# Patient Record
Sex: Female | Born: 1955 | Race: White | Hispanic: No | Marital: Married | Smoking: Former smoker
Health system: Southern US, Community
[De-identification: ages and names within clinical notes are randomized; demographics above are authoritative.]

## PROBLEM LIST (undated history)

## (undated) DIAGNOSIS — M810 Age-related osteoporosis without current pathological fracture: Secondary | ICD-10-CM

## (undated) DIAGNOSIS — I4891 Unspecified atrial fibrillation: Secondary | ICD-10-CM

## (undated) HISTORY — PX: OTHER SURGICAL HISTORY: SHX169

---

## 2018-03-26 ENCOUNTER — Emergency Department (HOSPITAL_BASED_OUTPATIENT_CLINIC_OR_DEPARTMENT_OTHER)
Admission: EM | Admit: 2018-03-26 | Discharge: 2018-03-26 | Disposition: A | Discharge status: Other Acute Inpatient Hospital | Payer: Worker's Compensation | Attending: Emergency Medicine | Admitting: Emergency Medicine

## 2018-03-26 ENCOUNTER — Other Ambulatory Visit: Payer: Self-pay

## 2018-03-26 ENCOUNTER — Emergency Department (HOSPITAL_BASED_OUTPATIENT_CLINIC_OR_DEPARTMENT_OTHER): Payer: Worker's Compensation

## 2018-03-26 ENCOUNTER — Encounter (HOSPITAL_BASED_OUTPATIENT_CLINIC_OR_DEPARTMENT_OTHER): Payer: Self-pay

## 2018-03-26 DIAGNOSIS — Y929 Unspecified place or not applicable: Secondary | ICD-10-CM | POA: Diagnosis not present

## 2018-03-26 DIAGNOSIS — Z7901 Long term (current) use of anticoagulants: Secondary | ICD-10-CM | POA: Diagnosis not present

## 2018-03-26 DIAGNOSIS — Z79899 Other long term (current) drug therapy: Secondary | ICD-10-CM | POA: Diagnosis not present

## 2018-03-26 DIAGNOSIS — S72002A Fracture of unspecified part of neck of left femur, initial encounter for closed fracture: Secondary | ICD-10-CM

## 2018-03-26 DIAGNOSIS — S79912A Unspecified injury of left hip, initial encounter: Secondary | ICD-10-CM | POA: Diagnosis present

## 2018-03-26 DIAGNOSIS — Y990 Civilian activity done for income or pay: Secondary | ICD-10-CM | POA: Diagnosis not present

## 2018-03-26 DIAGNOSIS — W010XXA Fall on same level from slipping, tripping and stumbling without subsequent striking against object, initial encounter: Secondary | ICD-10-CM | POA: Diagnosis not present

## 2018-03-26 DIAGNOSIS — S32502A Unspecified fracture of left pubis, initial encounter for closed fracture: Secondary | ICD-10-CM | POA: Diagnosis not present

## 2018-03-26 DIAGNOSIS — W19XXXA Unspecified fall, initial encounter: Secondary | ICD-10-CM

## 2018-03-26 DIAGNOSIS — Y939 Activity, unspecified: Secondary | ICD-10-CM | POA: Insufficient documentation

## 2018-03-26 HISTORY — DX: Age-related osteoporosis without current pathological fracture: M81.0

## 2018-03-26 HISTORY — DX: Unspecified atrial fibrillation: I48.91

## 2018-03-26 LAB — BASIC METABOLIC PANEL
Anion gap: 11 (ref 5–15)
BUN: 7 mg/dL (ref 6–20)
CO2: 23 mmol/L (ref 22–32)
Calcium: 8.8 mg/dL — ABNORMAL LOW (ref 8.9–10.3)
Chloride: 102 mmol/L (ref 98–111)
Creatinine, Ser: 0.55 mg/dL (ref 0.44–1.00)
GFR calc Af Amer: >60 mL/min (ref >60)
GFR calc non Af Amer: >60 mL/min (ref >60)
Glucose, Bld: 109 mg/dL — ABNORMAL HIGH (ref 70–99)
Potassium: 4.1 mmol/L (ref 3.5–5.1)
Sodium: 136 mmol/L (ref 135–145)

## 2018-03-26 LAB — CBC WITH DIFFERENTIAL/PLATELET
Basophils Absolute: 0 10*3/uL (ref 0.0–0.1)
Basophils Relative: 0 %
Eosinophils Absolute: 0 10*3/uL (ref 0.0–0.7)
Eosinophils Relative: 0 %
HCT: 38.7 % (ref 36.0–46.0)
Hemoglobin: 13.8 g/dL (ref 12.0–15.0)
Lymphocytes Relative: 9 %
Lymphs Abs: 1 10*3/uL (ref 0.7–4.0)
MCH: 34.3 pg — ABNORMAL HIGH (ref 26.0–34.0)
MCHC: 35.7 g/dL (ref 30.0–36.0)
MCV: 96.3 fL (ref 78.0–100.0)
Monocytes Absolute: 0.6 10*3/uL (ref 0.1–1.0)
Monocytes Relative: 6 %
Neutro Abs: 9.8 10*3/uL — ABNORMAL HIGH (ref 1.7–7.7)
Neutrophils Relative %: 85 %
Platelets: 266 10*3/uL (ref 150–400)
RBC: 4.02 MIL/uL (ref 3.87–5.11)
RDW: 11.7 % (ref 11.5–15.5)
WBC: 11.5 10*3/uL — ABNORMAL HIGH (ref 4.0–10.5)

## 2018-03-26 MED ORDER — MORPHINE SULFATE (PF) 4 MG/ML IV SOLN
4.0000 mg | INTRAVENOUS | Status: DC | PRN
  Filled 2018-03-26: qty 1

## 2018-03-26 MED ORDER — ACETAMINOPHEN 500 MG PO TABS
1000.0000 mg | ORAL_TABLET | Freq: Four times a day (QID) | ORAL | Status: DC | PRN
  Administered 2018-03-26: 1000 mg via ORAL

## 2018-03-26 MED ORDER — ACETAMINOPHEN 500 MG PO TABS
1000.0000 mg | ORAL_TABLET | Freq: Once | ORAL | Status: AC
  Administered 2018-03-26: 1000 mg via ORAL
  Filled 2018-03-26: qty 2

## 2018-03-26 MED ORDER — ACETAMINOPHEN 500 MG PO TABS
ORAL_TABLET | ORAL | Status: AC
  Filled 2018-03-26: qty 2

## 2018-03-26 NOTE — ED Notes (Signed)
Report to [facility]hering[facility]oughCrystal, Charity fundraiserN [facility].

## 2018-03-26 NOTE — ED Notes (Signed)
[facility]

## 2018-03-26 NOTE — ED Notes (Signed)
Pt in XR. 

## 2018-03-26 NOTE — ED Triage Notes (Signed)
PTAR report-pt fell at work approx 1023am-tylenol 1000mg  taken after-c/o pain to left hip -[facility]came in via stretcher and pivoted to w/c-pt agrees to report-to triage in w/c-NAD

## 2018-03-26 NOTE — ED Provider Notes (Addendum)
[facility] EMERGENCY DEPARTMENT Provider Note   CSN: [id number] Arrival date & time: 03/26/18  1253     History   Chief Complaint Chief Complaint  Patient presents with  . Fall    HPI Barbara Terry is a 62 y.o. female with history of A. fib on Xarelto presents today for evaluation of acute onset, constant left hip pain secondary to mechanical fall earlier today.  She states that at around 10:30 AM this morning she was at work when she bent down to reach for an object that was falling and in the process fell on her left hip.  She denies head injury or loss of consciousness.  She denies headache or neck pain.  She denies back pain.  She notes aching pain to the left hip which is constant and becomes more severe with any flexion or certain position changes.  Pain radiates to the knee.  She has not been able to bear weight on the extremity since the fall.  She states she took Tylenol at around 1030 this morning with improvement in her pain.  The history is provided by the patient.    Past Medical History:  Diagnosis Date  . A-fib (HCC)   . Osteoporosis     There are no active problems to display for this patient.   Past Surgical History:  Procedure Laterality Date  . arm surgery       OB History   None      Home Medications    Prior to Admission medications   Not on File    Family History No family history on file.  Social History Social History   Tobacco Use  . Smoking status: Former Games developermoker  . Smokeless tobacco: Never Used  Substance Use Topics  . Alcohol use: Yes    Comment: occ  . Drug use: Never     Allergies   Codeine   Review of Systems Review of Systems  Constitutional: Negative for chills and fever.  Musculoskeletal: Positive for arthralgias and myalgias. Negative for back pain and neck pain.  Neurological: Negative for syncope, weakness, numbness and headaches.  Hematological: Bruises/bleeds easily.     Physical Exam Updated  Vital Signs BP 134/74 (BP Location: Left Arm)   Pulse 76   Temp 98.4 F (36.9 C) (Oral)   Resp 16   Ht 5\' 7"  (1.702 m)   Wt 56.7 kg   SpO2 100%   BMI 19.58 kg/m   Physical Exam  Constitutional: She appears well-developed and well-nourished. No distress.  HENT:  Head: Normocephalic and atraumatic.  No Battle's signs, no raccoon's eyes, no rhinorrhea. No hemotympanum. No tenderness to palpation of the face or skull. No deformity, crepitus, or swelling noted.   Eyes: Conjunctivae are normal. Right eye exhibits no discharge. Left eye exhibits no discharge.  Neck: Normal range of motion. Neck supple. No JVD present.  No midline spine TTP, no paraspinal muscle tenderness, no deformity, crepitus, or step-off noted   Cardiovascular: Normal rate and intact distal pulses.  2+ DP/PT pulses bilaterally.  Compartments are soft, no lower extreme edema, no palpable cords, Homans sign absent bilaterally  Pulmonary/Chest: Effort normal.  Abdominal: She exhibits no distension.  Musculoskeletal: She exhibits tenderness. She exhibits no edema.  No midline spine TTP, no paraspinal muscle tenderness, no deformity, crepitus, or step-off noted.  Mild tenderness to palpation overlying the initial tuberosity on the left.  Subtle leg length discrepancy, left shorter than right.  Patient is able to tense  her quadriceps muscles but unable to lift/flex the left lower extremity.  Otherwise 5/5 strength of BLE major muscle groups.  No erythema, deformity, crepitus noted.  Neurological: She is alert.  Fluent speech, no facial droop, sensation intact to soft touch of bilateral lower extremity's.  Unable to assess gait secondary to patient's pain.  Skin: Skin is warm and dry. No erythema.  Psychiatric: She has a normal mood and affect. Her behavior is normal.  Nursing note and vitals reviewed.    ED Treatments / Results  Labs (all labs ordered are listed, but only abnormal results are displayed) Labs Reviewed    BASIC METABOLIC PANEL - Abnormal; Notable for the following components:      Result Value   Glucose, Bld 109 (*)    Calcium 8.8 (*)    All other components within normal limits  CBC WITH DIFFERENTIAL/PLATELET - Abnormal; Notable for the following components:   WBC 11.5 (*)    MCH 34.3 (*)    Neutro Abs 9.8 (*)    All other components within normal limits    EKG None  Radiology Ct Hip Left Wo Contrast  Result Date: 03/26/2018 CLINICAL DATA:  Left hip pain after fall. EXAM: CT OF THE LEFT HIP WITHOUT CONTRAST TECHNIQUE: Multidetector CT imaging of the left hip was performed according to the standard protocol. Multiplanar CT image reconstructions were also generated. COMPARISON:  Left hip x-rays from same day. FINDINGS: Bones/Joint/Cartilage Acute, minimally impacted left subcapital femoral neck fracture. Nondisplaced fracture of the left parasymphyseal pubic bone. Probable nondisplaced fracture of the left inferior pubic ramus. No dislocation. Small lipohemarthrosis. Ligaments Suboptimally assessed by CT. Muscles and Tendons Unremarkable. Soft tissues Partially visualized large hypodense mass in the pelvis, measuring at least 13.8 cm. IMPRESSION: 1. Acute, minimally impacted left subcapital femoral neck fracture. 2. Nondisplaced fracture of the left parasymphyseal pubic bone. Probable nondisplaced fracture of the left inferior pubic ramus. 3. Partially visualized large hypodense mass in the pelvis, measuring at least 13.8 cm. This may be ovarian in etiology. Recommend CT of the abdomen and pelvis with contrast for further evaluation. Electronically Signed   By: Obie Dredge M.D.   On: 03/26/2018 15:55   Dg Hip Unilat With Pelvis 2-3 Views Left  Result Date: 03/26/2018 CLINICAL DATA:  Fall. EXAM: DG HIP (WITH OR WITHOUT PELVIS) 2-3V LEFT COMPARISON:  No recent. FINDINGS: Diffuse osteopenia. Degenerative change lumbar spine and both hips. Subtle fracture of the left femoral neck cannot be  excluded. IMPRESSION: 1.  Subtle fracture of the left femoral neck cannot be excluded. 2. Diffuse osteopenia. Degenerative changes lumbar spine and both hips. Electronically Signed   By: Maisie Fus  Register   On: 03/26/2018 14:29    Procedures Procedures (including critical care time)  Medications Ordered in ED Medications  morphine 4 MG/ML injection 4 mg (has no administration in time range)  acetaminophen (TYLENOL) tablet 1,000 mg (1,000 mg Oral Given 03/26/18 1524)     Initial Impression / Assessment and Plan / ED Course  I have reviewed the triage vital signs and the nursing notes.  Pertinent labs & imaging results that were available during my care of the patient were reviewed by me and considered in my medical decision making (see chart for details).     Patient presents for evaluation of left hip pain secondary to mechanical fall earlier today.  She is afebrile, vital signs are stable.  She is nontoxic in appearance.  She is neurovascularly intact but is unable to flex  her left hip secondary to pain.  Compartments are soft.  Radiographs show diffuse osteopenia and cannot exclude left femoral neck fracture.  Given the patient is unable to bear weight or ambulate will obtain CT for further evaluation.  CT shows acute minimally impacted left subcapital femoral neck fracture as well as nondisplaced fracture of left parasymphyseal pubic bone.  She also has a probable nondisplaced fracture of the left inferior pubic ramus.  Pain managed with p.o. Tylenol while in the ED.  No sign of head trauma, no midline spine tenderness.  I do not see need for any further imaging.  [facility] and care. 6:28 PM Spoke with Dr. Oralia RudWilliam Craig with [facility] who agrees to assume care of patient and bring her into the hospital for further evaluation and management of her acute fractures.  On  reevaluation, patient remains in no acute distress, intact peripheral pulses with good sensation to soft touch.  Patient seen and evaluated by Dr. Adela LankFloyd who agrees with assessment and plan at this time.  Final Clinical Impressions(s) / ED Diagnoses   Final diagnoses:  Closed fracture of neck of left femur, initial encounter [facility](HCC)  Closed fracture of left pubis, unspecified portion of pubis, initial encounter [facility](HCC)  Fall, initial encounter    ED Discharge Orders    None       Jeanie SewerFawze, Kaiesha Tonner A, PA-C 03/26/18 2009    Floyd, Dan, DO 03/26/18 2017    Michela PitcherFawze, Keriann Rankin A, PA-C 03/26/18 2031    Melene PlanFloyd, Dan, DO 03/26/18 2133

## 2018-03-26 NOTE — ED Notes (Signed)
Patient transported to CT 

## 2019-12-06 IMAGING — CR DG HIP (WITH OR WITHOUT PELVIS) 2-3V*L*
3 series · 3 of 3 positions shown · non-contrast
Comparison: No recent.

CLINICAL DATA: Fall.

EXAM:
DG HIP (WITH OR WITHOUT PELVIS) 2-3V LEFT

[t pelvis a.p.]
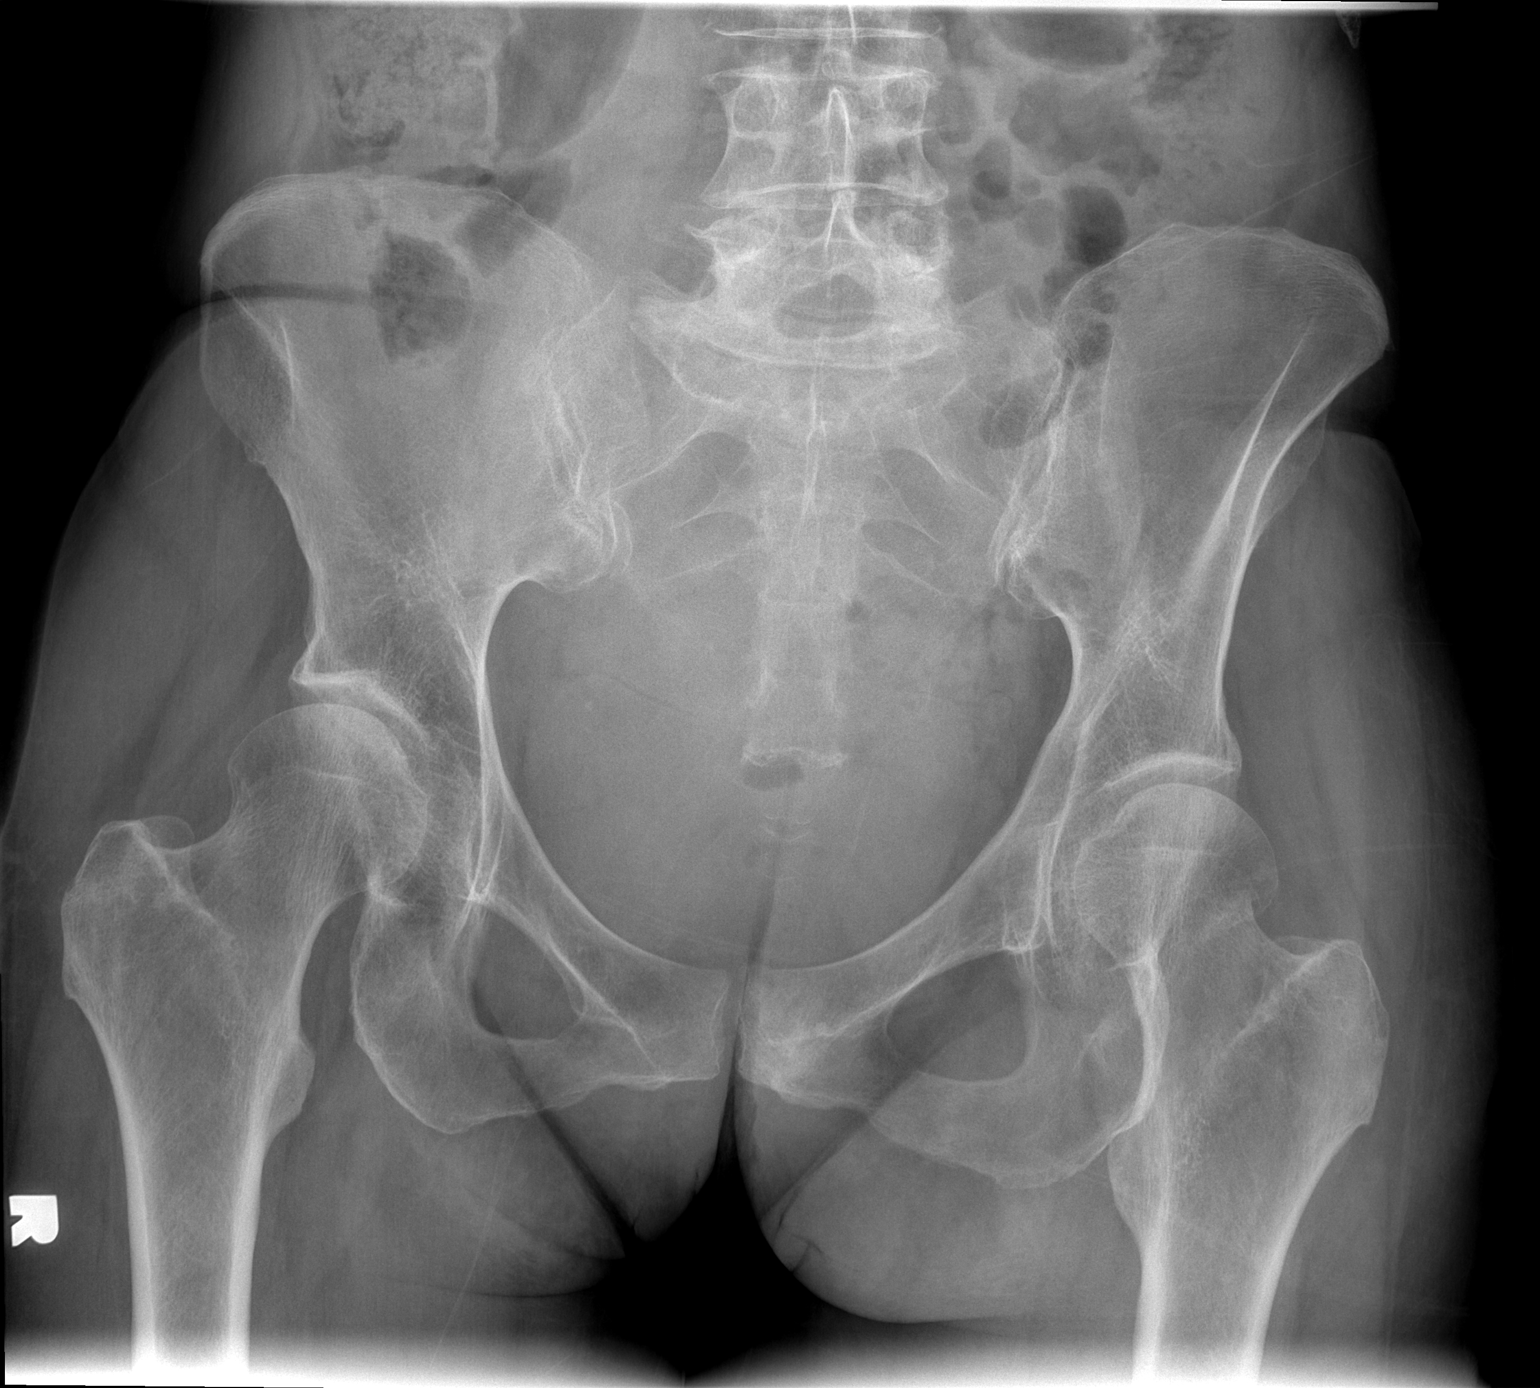

[t hip ap left]
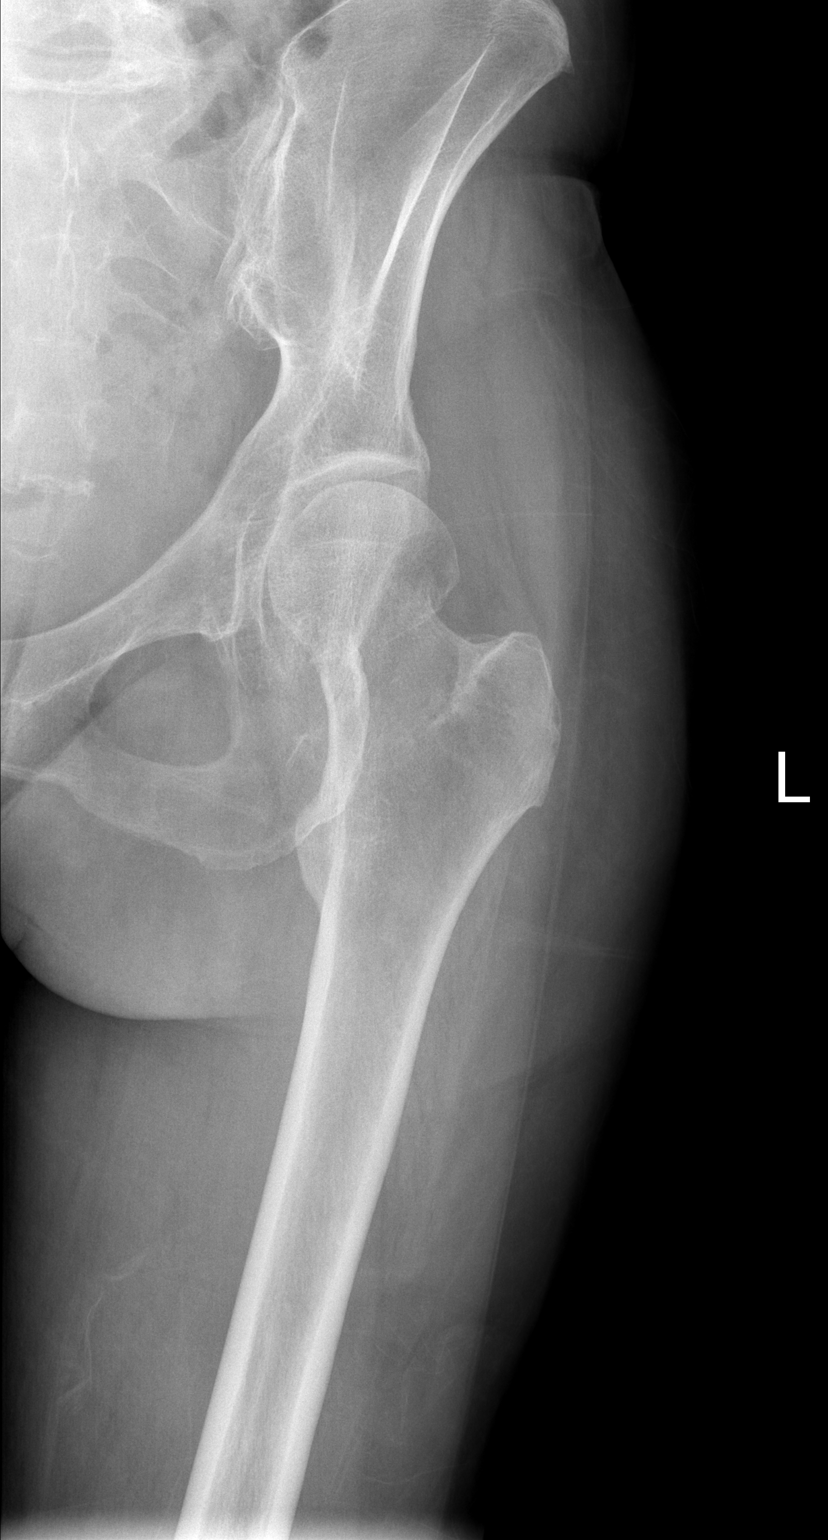

[t hip frog leg left]
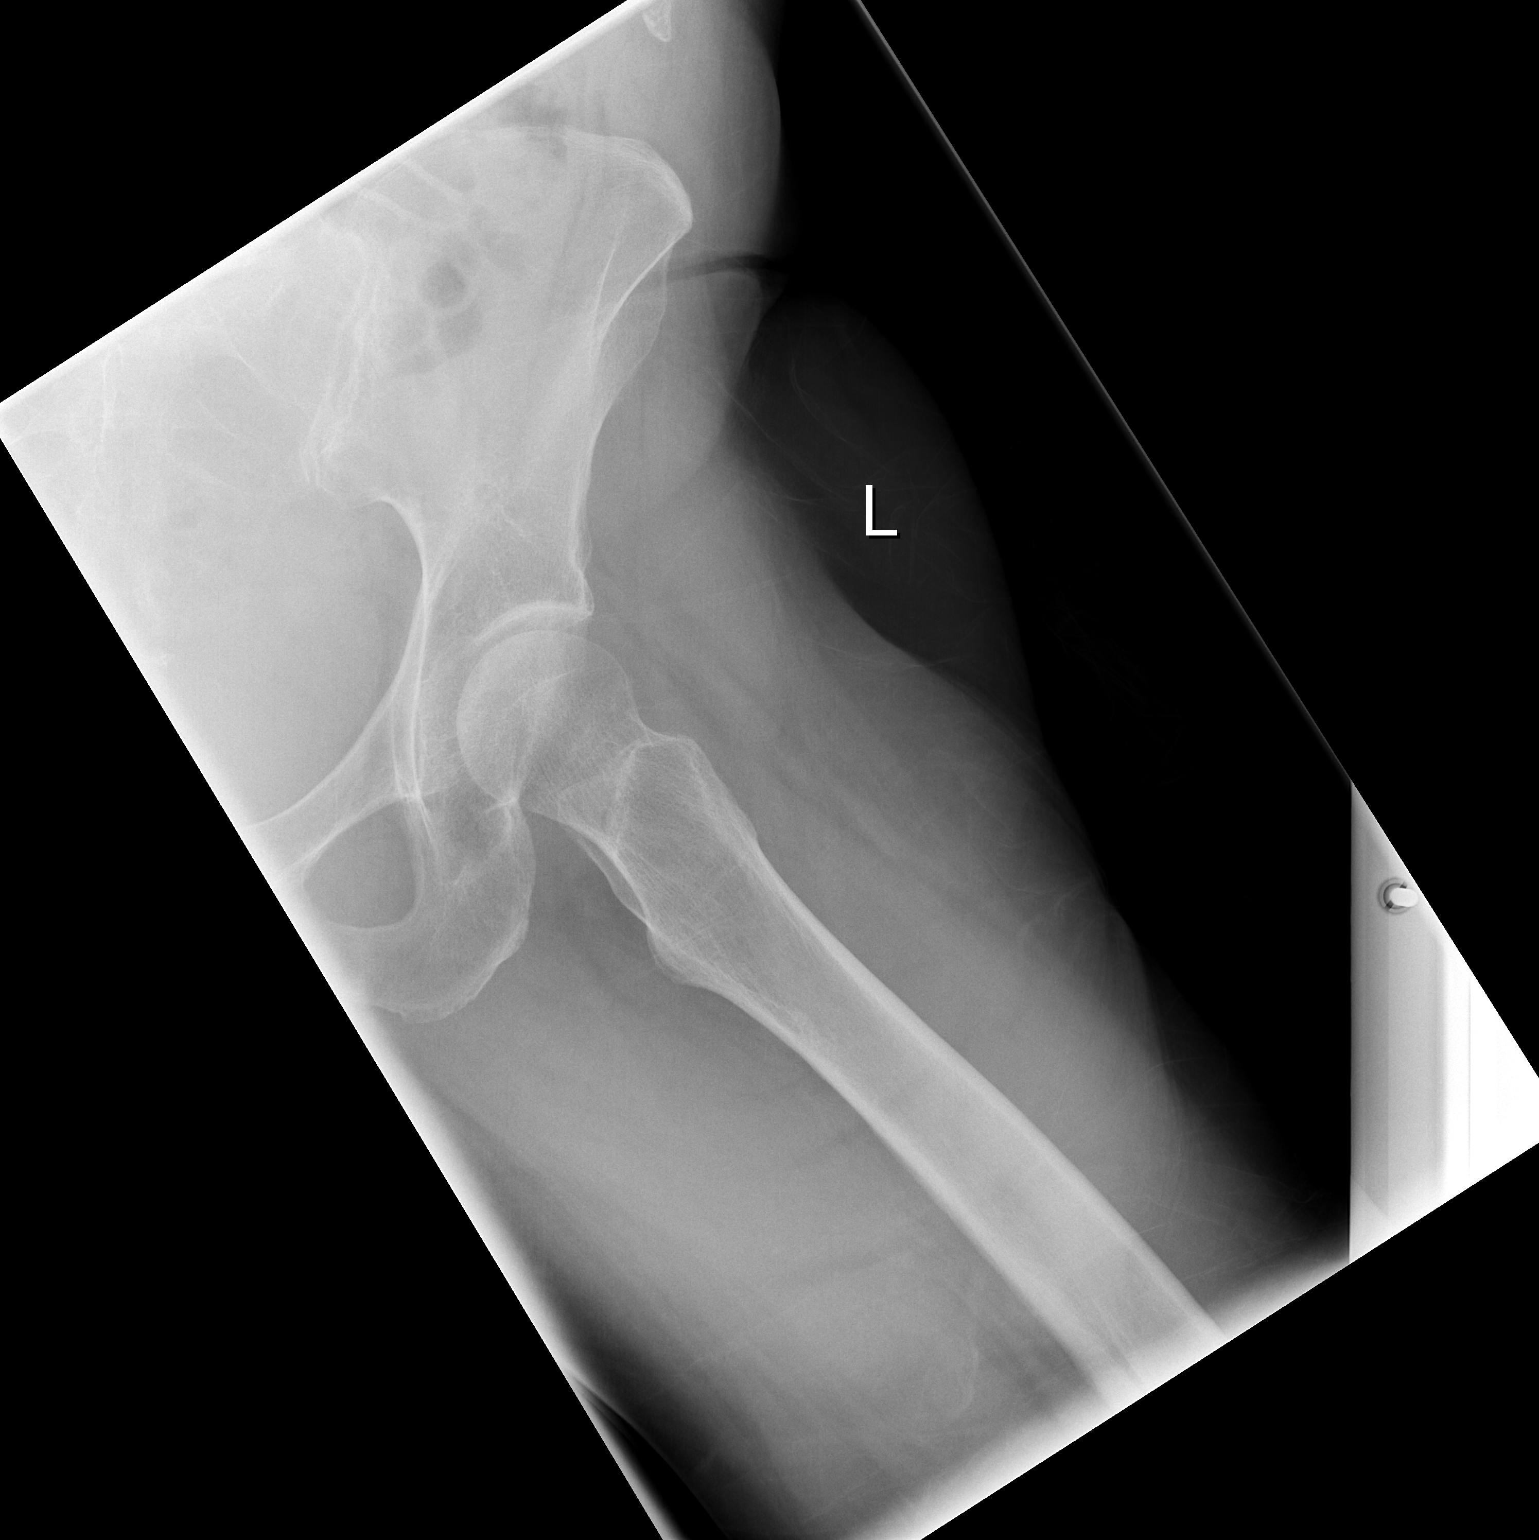

[3 of 3 positions shown; findings below may reference images not displayed]

FINDINGS: Diffuse osteopenia. Degenerative change lumbar spine and both hips.
Subtle fracture of the left femoral neck cannot be excluded.
IMPRESSION: 1.  Subtle fracture of the left femoral neck cannot be excluded.

2. Diffuse osteopenia. Degenerative changes lumbar spine and both
hips.

## 2019-12-06 IMAGING — CT CT HIP*L* W/O CM
2 of 3 series · 17 of 46 positions shown, 19 images · non-contrast
Comparison: Left hip x-rays from same day.

CLINICAL DATA: Left hip pain after fall.

EXAM:
CT OF THE LEFT HIP WITHOUT CONTRAST
TECHNIQUE: Multidetector CT imaging of the left hip was performed according to
the standard protocol. Multiplanar CT image reconstructions were
also generated.

[Series 5: coronal st · coronal · 0.37mm/px · 3 of 88 slices shown]
[im 30/88  soft-tissue]
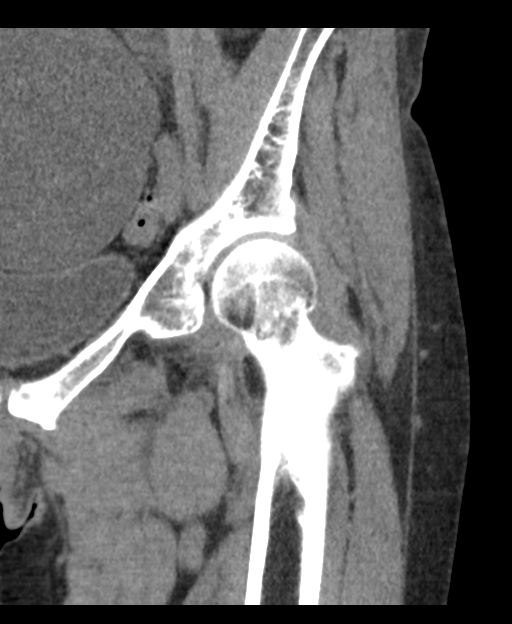
[im 39/88  soft-tissue]
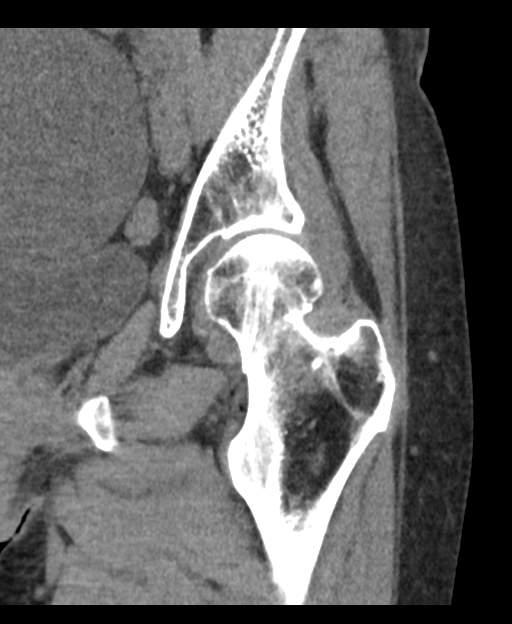
[im 49/88  soft-tissue]
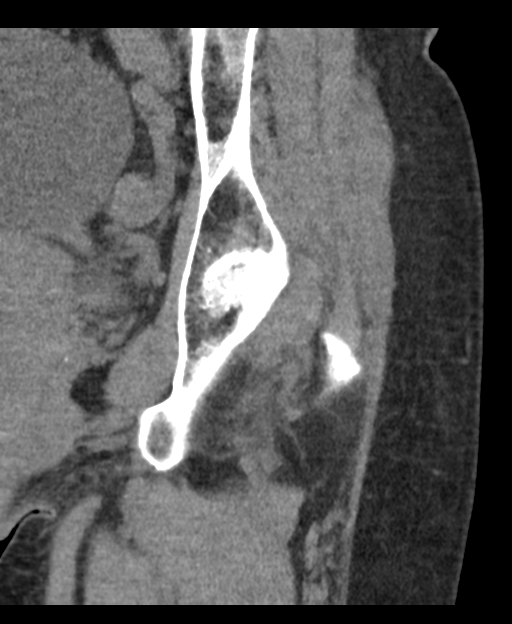

[Series 9: axial soft tissue · axial · 0.38mm/px · z∈[-284,-100]mm · 14 of 106 slices shown, 16 images]
[im 7/106  soft-tissue]
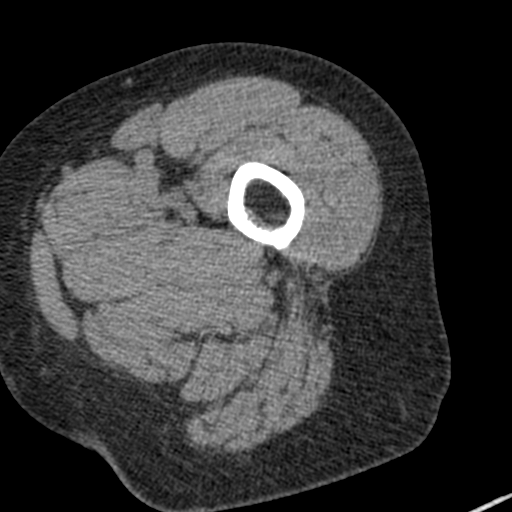
[im 7/106  bone]
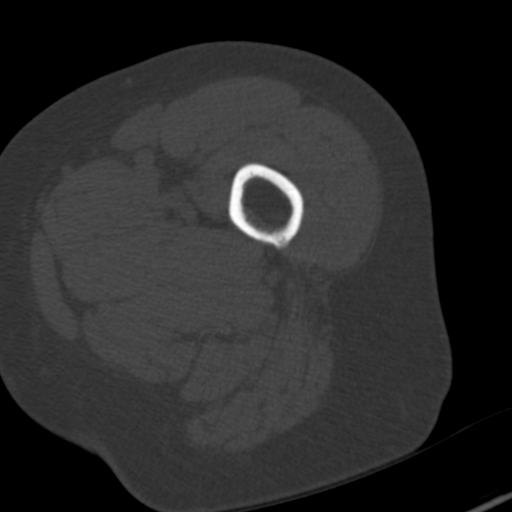
[im 14/106  soft-tissue]
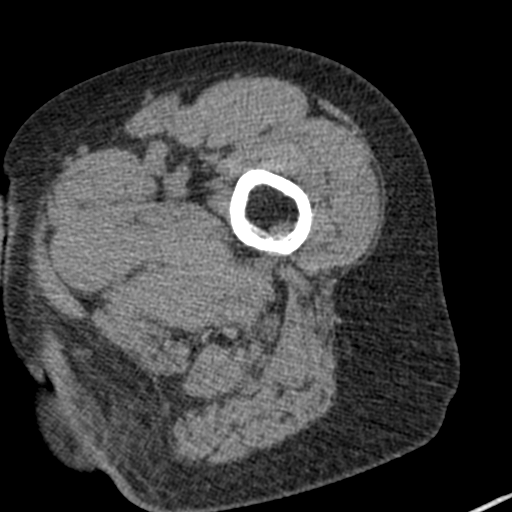
[im 21/106  soft-tissue]
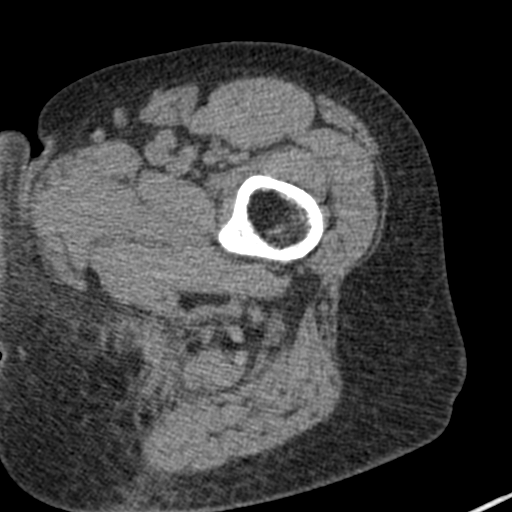
[im 28/106  soft-tissue]
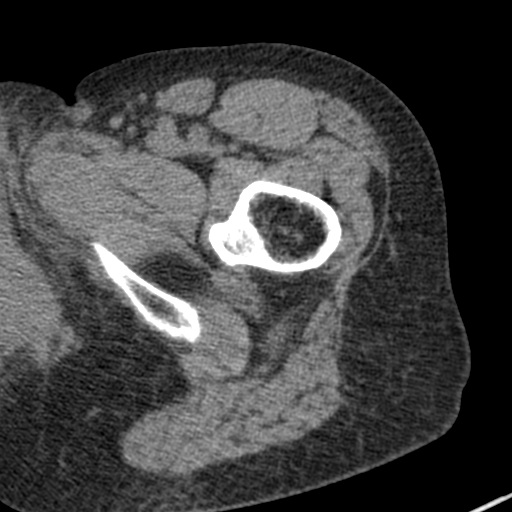
[im 34/106  soft-tissue]
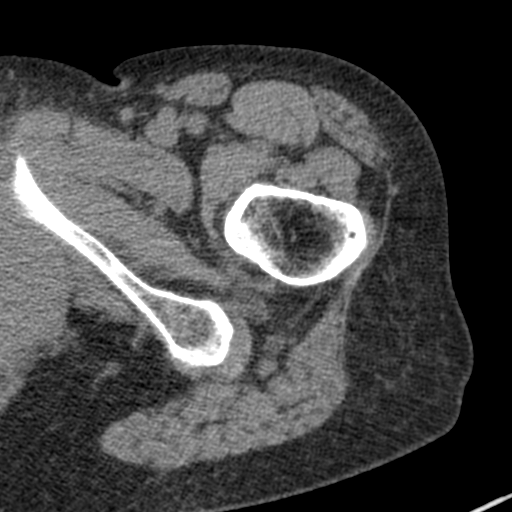
[im 41/106  soft-tissue]
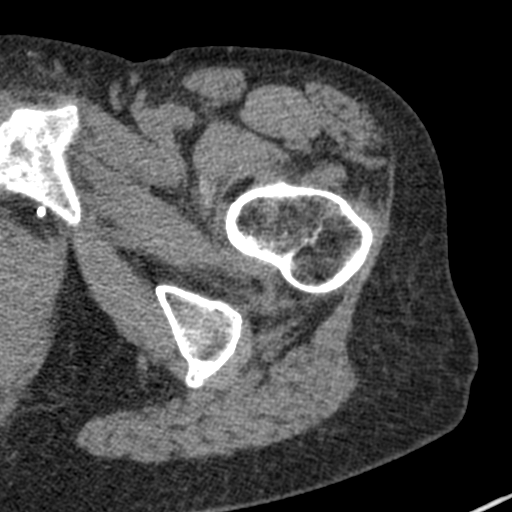
[im 48/106  soft-tissue]
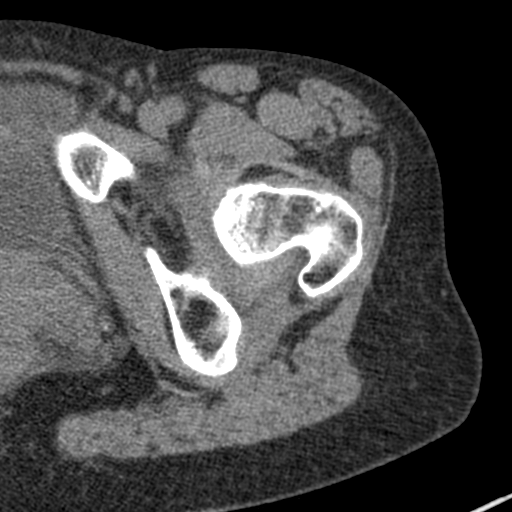
[im 58/106  soft-tissue]
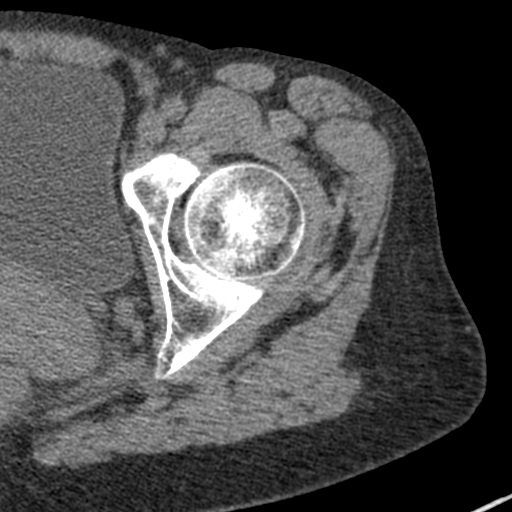
[im 65/106  soft-tissue]
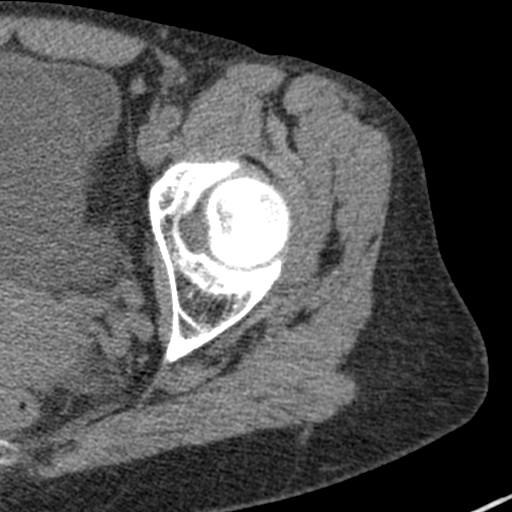
[im 65/106  bone]
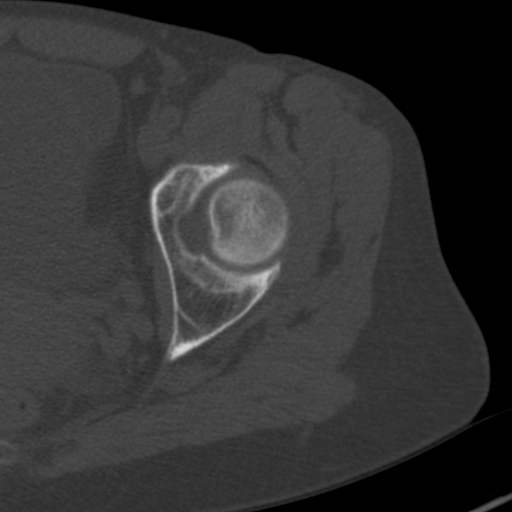
[im 72/106  soft-tissue]
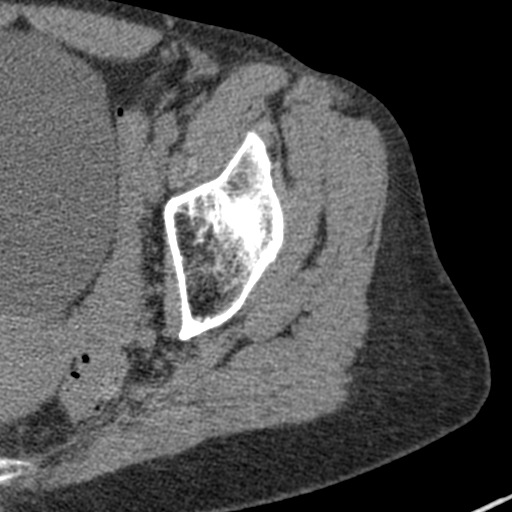
[im 78/106  soft-tissue]
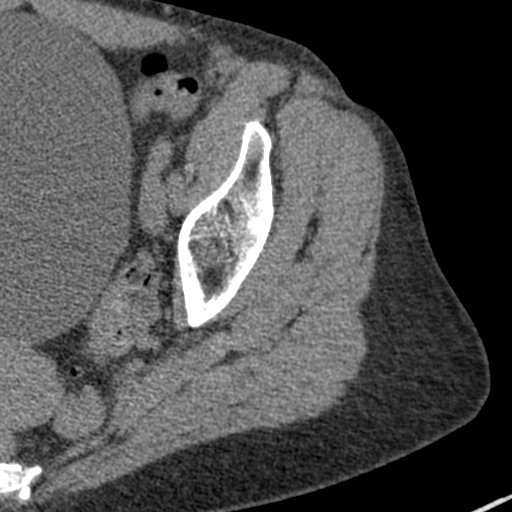
[im 85/106  soft-tissue]
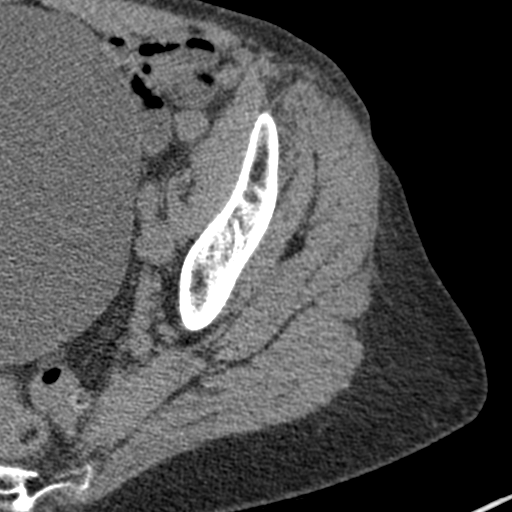
[im 92/106  soft-tissue]
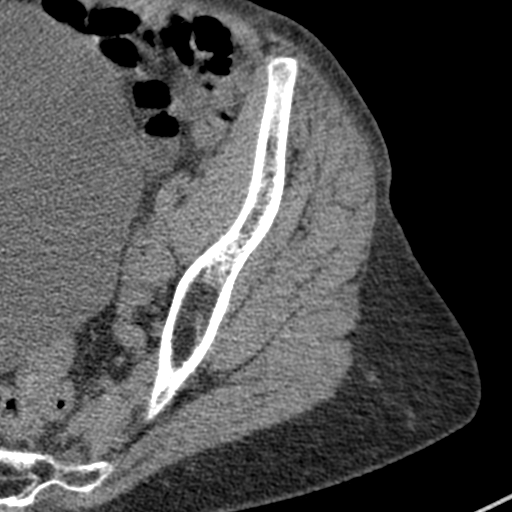
[im 99/106  soft-tissue]
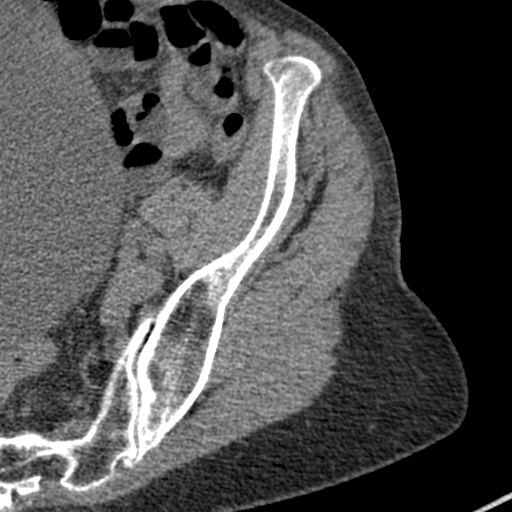

[17 of 46 positions shown; findings below may reference images not displayed]

FINDINGS: Bones/Joint/Cartilage

Acute, minimally impacted left subcapital femoral neck fracture.
Nondisplaced fracture of the left parasymphyseal pubic bone.
Probable nondisplaced fracture of the left inferior pubic ramus. No
dislocation. Small lipohemarthrosis.

Ligaments

Suboptimally assessed by CT.

Muscles and Tendons

Unremarkable.

Soft tissues

Partially visualized large hypodense mass in the pelvis, measuring
at least 13.8 cm.
IMPRESSION: 1. Acute, minimally impacted left subcapital femoral neck fracture.
2. Nondisplaced fracture of the left parasymphyseal pubic bone.
Probable nondisplaced fracture of the left inferior pubic ramus.
3. Partially visualized large hypodense mass in the pelvis,
measuring at least 13.8 cm. This may be ovarian in etiology.
Recommend CT of the abdomen and pelvis with contrast for further
evaluation.
